# Patient Record
Sex: Male | Born: 1960 | Race: White | Hispanic: No | Marital: Married | Smoking: Never smoker
Health system: Southern US, Community
[De-identification: ages and names within clinical notes are randomized; demographics above are authoritative.]

## PROBLEM LIST (undated history)

## (undated) DIAGNOSIS — E291 Testicular hypofunction: Secondary | ICD-10-CM

## (undated) DIAGNOSIS — R3129 Other microscopic hematuria: Secondary | ICD-10-CM

## (undated) DIAGNOSIS — N529 Male erectile dysfunction, unspecified: Secondary | ICD-10-CM

## (undated) DIAGNOSIS — N400 Enlarged prostate without lower urinary tract symptoms: Secondary | ICD-10-CM

## (undated) HISTORY — DX: Testicular hypofunction: E29.1

## (undated) HISTORY — DX: Male erectile dysfunction, unspecified: N52.9

## (undated) HISTORY — DX: Benign prostatic hyperplasia without lower urinary tract symptoms: N40.0

## (undated) HISTORY — DX: Other microscopic hematuria: R31.29

---

## 1987-11-13 HISTORY — PX: APPENDECTOMY: SHX54

## 1995-11-13 HISTORY — PX: OTHER SURGICAL HISTORY: SHX169

## 1998-11-12 HISTORY — PX: HERNIA REPAIR: SHX51

## 2009-02-04 HISTORY — PX: VASECTOMY: SHX75

## 2015-05-17 ENCOUNTER — Encounter: Payer: Self-pay | Admitting: *Deleted

## 2015-05-19 ENCOUNTER — Ambulatory Visit: Payer: Self-pay | Admitting: Urology

## 2015-05-27 ENCOUNTER — Other Ambulatory Visit: Payer: Self-pay | Admitting: Urology

## 2015-05-30 NOTE — Telephone Encounter (Signed)
Pt pharmacy is requesting a refill. Pt was last seen 05/2014. Please advise.

## 2015-06-07 ENCOUNTER — Ambulatory Visit: Payer: Self-pay | Admitting: Urology

## 2015-06-13 ENCOUNTER — Ambulatory Visit (INDEPENDENT_AMBULATORY_CARE_PROVIDER_SITE_OTHER): Payer: 59 | Admitting: Urology

## 2015-06-13 ENCOUNTER — Encounter: Payer: Self-pay | Admitting: Urology

## 2015-06-13 VITALS — BP 166/95 | HR 68 | Ht 70.0 in | Wt 205.2 lb

## 2015-06-13 DIAGNOSIS — N138 Other obstructive and reflux uropathy: Secondary | ICD-10-CM

## 2015-06-13 DIAGNOSIS — N401 Enlarged prostate with lower urinary tract symptoms: Secondary | ICD-10-CM

## 2015-06-13 DIAGNOSIS — N529 Male erectile dysfunction, unspecified: Secondary | ICD-10-CM | POA: Diagnosis not present

## 2015-06-13 LAB — MICROSCOPIC EXAMINATION: BACTERIA UA: NONE SEEN

## 2015-06-13 LAB — URINALYSIS, COMPLETE
BILIRUBIN UA: NEGATIVE
GLUCOSE, UA: NEGATIVE
KETONES UA: NEGATIVE
NITRITE UA: NEGATIVE
PH UA: 5.5 (ref 5.0–7.5)
Protein, UA: NEGATIVE
Specific Gravity, UA: 1.02 (ref 1.005–1.030)
UUROB: 0.2 mg/dL (ref 0.2–1.0)

## 2015-06-13 LAB — BLADDER SCAN AMB NON-IMAGING: Scan Result: 0

## 2015-06-13 MED ORDER — TADALAFIL 5 MG PO TABS: 5.0000 mg | ORAL_TABLET | Freq: Every day | ORAL | Status: AC

## 2015-06-13 MED ORDER — SILDENAFIL CITRATE 100 MG PO TABS: 100.0000 mg | ORAL_TABLET | Freq: Every day | ORAL | Status: AC | PRN

## 2015-06-13 NOTE — Progress Notes (Signed)
06/13/2015 8:25 AM   Gerald Santos 1961-01-12 [id number]  Referring provider: No referring provider defined for this encounter.  Chief Complaint  Patient presents with  . Erectile Dysfunction    Yearly check up    HPI: Gerald Santos is a 54 year old white male with erectile dysfunction and BPH with LUTS who presents today for his yearly follow-up.  Erectile dysfunction: His SHIM score is 13, which is mild to moderate ED.   He has been having difficulty with erections for six years.   His major complaint is maintaining an erection.  His libido is good.   His risk factors for ED are hypertension and BPH.  He denies any painful erections or curvatures with his erections.   He has tried Viagra and Cialis in the past with good results. He does find some cost prohibitive.      SHIM      06/13/15 1636       SHIM: Over the last 6 months:   How do you rate your confidence that you could get and keep an erection? Low     When you had erections with sexual stimulation, how often were your erections hard enough for penetration (entering your partner)? A Few Times (much less than half the time)     During sexual intercourse, how often were you able to maintain your erection after you had penetrated (entered) your partner? Very Difficult     During sexual intercourse, how difficult was it to maintain your erection to completion of intercourse? Difficult     When you attempted sexual intercourse, how often was it satisfactory for you? Slightly Difficult     SHIM Total Score   SHIM 13        Score: 1-7 Severe ED 8-11 Moderate ED 12-16 Mild-Moderate ED 17-21 Mild ED 22-25 No ED   BPH with LUTS: His IPSS score today is 2, which is mild lower urinary tract symptomatology. He is pleased with his quality life due to his urinary symptoms. His PVR is 0 mL.    His major complaints today are urinary frequency and nocturia.   They are not bothersome to him.  He has had these symptoms for six   years.  He denies any dysuria, hematuria or suprapubic pain.   He currently taking Cialis 5 mg daily.  Previous PSA's:     0.8 ng/mL on 05/09/2012     0.2 ng/mL on 05/08/2013     1.1 ng/mL on 05/03/2014  He also denies any recent fevers, chills, nausea or vomiting.  He does not have a family history of PCa.      IPSS      06/13/15 1600       International Prostate Symptom Score   How often have you had the sensation of not emptying your bladder? Not at All     How often have you had to urinate less than every two hours? Less than 1 in 5 times     How often have you found you stopped and started again several times when you urinated? Not at All     How often have you found it difficult to postpone urination? Not at All     How often have you had a weak urinary stream? Not at All     How often have you had to strain to start urination? Not at All     How many times did you typically get up at night to urinate?  1 Time     Total IPSS Score 2     Quality of Life due to urinary symptoms   If you were to spend the rest of your life with your urinary condition just the way it is now how would you feel about that? Pleased        Score:  1-7 Mild 8-19 Moderate 20-35 Severe       PMH: Past Medical History  Diagnosis Date  . Hypogonadism in male   . Microscopic hematuria   . Erectile dysfunction   . BPH (benign prostatic hyperplasia)     Surgical History: Past Surgical History  Procedure Laterality Date  . Vasectomy  02/04/2009  . Hernia repair Left 2000  . Carpal tunnel repair  1997  . Appendectomy  1989    Home Medications:    Medication List       This list is accurate as of: 06/13/15 11:59 PM.  Always use your most recent med list.               etodolac 400 MG tablet  Commonly known as:  LODINE  Take by mouth.     lisinopril 10 MG tablet  Commonly known as:  PRINIVIL,ZESTRIL  Take 10 mg by mouth daily.     lisinopril-hydrochlorothiazide 20-12.5  MG per tablet  Commonly known as:  PRINZIDE,ZESTORETIC  TAKE 1 TABLET BY MOUTH ONCE A DAY     sildenafil 100 MG tablet  Commonly known as:  VIAGRA  Take 1 tablet (100 mg total) by mouth daily as needed for erectile dysfunction.     tadalafil 5 MG tablet  Commonly known as:  CIALIS  Take 1 tablet (5 mg total) by mouth daily.        Allergies:  Allergies  Allergen Reactions  . Codeine Other (See Comments)    Family History: Family History  Problem Relation Age of Onset  . Colon cancer Father   . Schizophrenia Mother   . Lung cancer Father   . Diabetes Mellitus II Mother   . Alcohol abuse Father     Social History:  reports that he has never smoked. He does not have any smokeless tobacco history on file. He reports that he does not drink alcohol or use illicit drugs.  ROS: UROLOGY Frequent Urination?: No Hard to postpone urination?: No Burning/pain with urination?: No Get up at night to urinate?: No Leakage of urine?: No Urine stream starts and stops?: No Trouble starting stream?: No Do you have to strain to urinate?: No Blood in urine?: No Urinary tract infection?: No Sexually transmitted disease?: No Injury to kidneys or bladder?: No Painful intercourse?: No Weak stream?: No Erection problems?: Yes Penile pain?: No  Gastrointestinal Nausea?: No Vomiting?: No Indigestion/heartburn?: No Diarrhea?: No Constipation?: No  Constitutional Fever: No Night sweats?: No Weight loss?: No Fatigue?: No  Skin Skin rash/lesions?: No Itching?: No  Eyes Blurred vision?: No Double vision?: No  Ears/Nose/Throat Sore throat?: No Sinus problems?: No  Hematologic/Lymphatic Swollen glands?: No Easy bruising?: No  Cardiovascular Leg swelling?: No Chest pain?: No  Respiratory Cough?: No Shortness of breath?: No  Endocrine Excessive thirst?: No  Musculoskeletal Back pain?: No Joint pain?: No  Neurological Headaches?: No Dizziness?:  No  Psychologic Depression?: No Anxiety?: No  Physical Exam: BP 166/95 mmHg  Pulse 68  Ht 5\' 10"  (1.778 m)  Wt 205 lb 3.2 oz (93.078 kg)  BMI 29.44 kg/m2  GU: Patient with  uncircumcised phallus. Foreskin easily retracted,  though a band is forming around coronal edge.  Urethral meatus is patent.  No penile discharge.  There is a erythematous,  flat lesion located at the 7:00 position on the glands from the meatus. It is irregular and 2 mm by 3 mm. The patient states it comes and goes with sexual activity.  Scrotum without lesions, cysts, rashes and/or edema.  Testicles are located scrotally bilaterally. No masses are appreciated in the testicles. Left and right epididymis are normal. Rectal: Patient with  normal sphincter tone. Perineum without scarring or rashes. No rectal masses are appreciated. Prostate is approximately 45 grams, no nodules are appreciated. Seminal vesicles are normal.    Laboratory Data: Results for orders placed or performed in visit on 06/13/15  Microscopic Examination  Result Value Ref Range   WBC, UA 0-5 0 -  5 /hpf   RBC, UA 0-2 0 -  2 /hpf   Epithelial Cells (non renal) CANCELED    Bacteria, UA None seen None seen/Few  Urinalysis, Complete  Result Value Ref Range   Specific Gravity, UA 1.020 1.005 - 1.030   pH, UA 5.5 5.0 - 7.5   Color, UA Yellow Yellow   Appearance Ur Clear Clear   Leukocytes, UA Trace (A) Negative   Protein, UA Negative Negative/Trace   Glucose, UA Negative Negative   Ketones, UA Negative Negative   RBC, UA Trace (A) Negative   Bilirubin, UA Negative Negative   Urobilinogen, Ur 0.2 0.2 - 1.0 mg/dL   Nitrite, UA Negative Negative   Microscopic Examination See below:   PSA  Result Value Ref Range   Prostate Specific Ag, Serum 0.8 0.0 - 4.0 ng/mL  BLADDER SCAN AMB NON-IMAGING  Result Value Ref Range   Scan Result 0    No results found for: WBC, HGB, HCT, MCV, PLT  No results found for: CREATININE  Lab Results  Component  Value Date   PSA 0.8 06/13/2015    No results found for: TESTOSTERONE  No results found for: HGBA1C  Urinalysis    Component Value Date/Time   GLUCOSEU Negative 06/13/2015 1619   BILIRUBINUR Negative 06/13/2015 1619   NITRITE Negative 06/13/2015 1619   LEUKOCYTESUR Trace* 06/13/2015 1619    Pertinent Imaging:   Assessment & Plan:    1. Erectile dysfunction, unspecified erectile dysfunction type:   Patient's SHIM score is 13.  He responds well to Viagra. A prescription for Viagra 100 mg is he scribed to his pharmacy. He will follow-up in one year for SHIM score.  2. BPH with LUTS:   Patient's IPSS score is 2/1.  His PVR 0 mL.  His DRE demonstrates mild enlargement.  Patient like to continue medical treatment.  I have sent a prescription to his pharmacy for Cialis 5 mg daily.  He will follow up in 12  months for a PSA, DRE, PVR and an IPSS.    - Urinalysis, Complete - PSA - BLADDER SCAN AMB NON-IMAGING   Return in about 1 year (around 06/12/2016) for IPSS, SHIM and PVR.  PSA to be drawn before appointment   Zara Council, [place] [address] [place], [place] [address] [phone]

## 2015-06-14 ENCOUNTER — Telehealth: Payer: Self-pay

## 2015-06-14 DIAGNOSIS — R972 Elevated prostate specific antigen [PSA]: Secondary | ICD-10-CM

## 2015-06-14 LAB — PSA: Prostate Specific Ag, Serum: 0.8 ng/mL (ref 0.0–4.0)

## 2015-06-14 NOTE — Telephone Encounter (Signed)
Pt returned call. Lab results were given. Pt voiced understanding.

## 2015-06-14 NOTE — Telephone Encounter (Signed)
-----   Message from Nori Riis, PA-C sent at 06/14/2015  8:36 AM EDT ----- Patient's PSA is stable.  We will see him in 12 months.  PSA to be drawn before his next appointment.

## 2015-06-14 NOTE — Telephone Encounter (Signed)
LMOM

## 2015-06-16 DIAGNOSIS — N138 Other obstructive and reflux uropathy: Secondary | ICD-10-CM | POA: Insufficient documentation

## 2015-06-16 DIAGNOSIS — N529 Male erectile dysfunction, unspecified: Secondary | ICD-10-CM | POA: Insufficient documentation

## 2015-06-16 DIAGNOSIS — N401 Enlarged prostate with lower urinary tract symptoms: Secondary | ICD-10-CM

## 2016-06-04 ENCOUNTER — Other Ambulatory Visit: Payer: 59

## 2016-06-04 ENCOUNTER — Encounter: Payer: Self-pay | Admitting: Urology

## 2016-06-12 ENCOUNTER — Encounter: Payer: Self-pay | Admitting: Urology

## 2016-06-12 ENCOUNTER — Ambulatory Visit: Payer: 59 | Admitting: Urology

## 2016-10-19 ENCOUNTER — Other Ambulatory Visit: Payer: Self-pay | Admitting: Gastroenterology

## 2016-10-19 DIAGNOSIS — R1310 Dysphagia, unspecified: Secondary | ICD-10-CM

## 2016-10-23 ENCOUNTER — Ambulatory Visit
Admission: RE | Admit: 2016-10-23 | Discharge: 2016-10-23 | Disposition: A | Discharge status: Home / Self Care | Payer: Managed Care, Other (non HMO) | Source: Ambulatory Visit | Attending: Gastroenterology | Admitting: Gastroenterology

## 2016-10-23 DIAGNOSIS — K219 Gastro-esophageal reflux disease without esophagitis: Secondary | ICD-10-CM | POA: Insufficient documentation

## 2016-10-23 DIAGNOSIS — R1310 Dysphagia, unspecified: Secondary | ICD-10-CM

## 2016-11-20 ENCOUNTER — Other Ambulatory Visit: Payer: Self-pay | Admitting: Gastroenterology

## 2016-11-20 DIAGNOSIS — D369 Benign neoplasm, unspecified site: Secondary | ICD-10-CM

## 2016-11-30 ENCOUNTER — Inpatient Hospital Stay: Admission: RE | Admit: 2016-11-30 | Payer: Managed Care, Other (non HMO) | Source: Ambulatory Visit

## 2016-12-07 ENCOUNTER — Ambulatory Visit
Admission: RE | Admit: 2016-12-07 | Discharge: 2016-12-07 | Disposition: A | Discharge status: Home / Self Care | Payer: Managed Care, Other (non HMO) | Source: Ambulatory Visit | Attending: Gastroenterology | Admitting: Gastroenterology

## 2016-12-07 DIAGNOSIS — D369 Benign neoplasm, unspecified site: Secondary | ICD-10-CM

## 2019-12-22 ENCOUNTER — Other Ambulatory Visit: Payer: Self-pay | Admitting: Internal Medicine

## 2019-12-22 DIAGNOSIS — Z800 Family history of malignant neoplasm of digestive organs: Secondary | ICD-10-CM

## 2019-12-22 DIAGNOSIS — Z86010 Personal history of colonic polyps: Secondary | ICD-10-CM

## 2020-01-11 ENCOUNTER — Ambulatory Visit
Admission: RE | Admit: 2020-01-11 | Discharge: 2020-01-11 | Disposition: A | Discharge status: Home / Self Care | Payer: Managed Care, Other (non HMO) | Source: Ambulatory Visit | Attending: Internal Medicine | Admitting: Internal Medicine

## 2020-01-11 DIAGNOSIS — Z86010 Personal history of colonic polyps: Secondary | ICD-10-CM

## 2020-01-11 DIAGNOSIS — Z800 Family history of malignant neoplasm of digestive organs: Secondary | ICD-10-CM

## 2020-02-07 ENCOUNTER — Ambulatory Visit: Payer: 59 | Attending: Internal Medicine

## 2020-02-07 DIAGNOSIS — Z23 Encounter for immunization: Secondary | ICD-10-CM

## 2020-02-07 NOTE — Progress Notes (Signed)
   Covid-19 Vaccination Clinic  Name:  Gerald Santos    MRN: [id number] DOB: April 22, 1961  02/07/2020  Gerald Santos was observed post Covid-19 immunization for 15 minutes without incident. He was provided with Vaccine Information Sheet and instruction to access the V-Safe system.   Gerald Santos was instructed to call 911 with any severe reactions post vaccine: Marland Kitchen Difficulty breathing  . Swelling of face and throat  . A fast heartbeat  . A bad rash all over body  . Dizziness and weakness   Immunizations Administered    Name Date Dose VIS Date Route   Pfizer COVID-19 Vaccine 02/07/2020 10:59 AM 0.3 mL 10/23/2019 Intramuscular   Manufacturer: [employer]   Lot: [id number]   [place]: [id number]

## 2020-03-01 ENCOUNTER — Ambulatory Visit: Payer: 59 | Attending: Internal Medicine

## 2020-03-01 DIAGNOSIS — Z23 Encounter for immunization: Secondary | ICD-10-CM

## 2020-03-01 NOTE — Progress Notes (Signed)
   Covid-19 Vaccination Clinic  Name:  Gerald Santos    MRN: [id number] DOB: Aug 21, 1961  03/01/2020  [name] was observed post Covid-19 immunization for 15 minutes without incident. He was provided with Vaccine Information Sheet and instruction to access the V-Safe system.   [name] was instructed to call 911 with any severe reactions post vaccine: Marland Kitchen Difficulty breathing  . Swelling of face and throat  . A fast heartbeat  . A bad rash all over body  . Dizziness and weakness   Immunizations Administered    Name Date Dose VIS Date Route   Pfizer COVID-19 Vaccine 03/01/2020  1:34 PM 0.3 mL 01/06/2019 Intramuscular   Manufacturer: [place]   Lot: [id number]   [place]: [id number]

## 2020-11-26 IMAGING — CT CT VIRTUAL COLONOSCOPY SCREENING
3 of 9 series · 15 of 46 positions shown, 17 images · non-contrast
Comparison: 12/07/2016

CLINICAL DATA: Screening

EXAM:
CT VIRTUAL COLONOSCOPY SCREENING
TECHNIQUE: The patient was given a standard bowel preparation with Gastrografin
and barium for fluid and stool tagging respectively. The quality of
the bowel preparation is moderate. Automated CO2 insufflation of the
colon was performed prior to image acquisition and colonic
distention is moderate. Image post processing was used to generate a
3D endoluminal fly-through projection of the colon and to
electronically subtract stool/fluid as appropriate.

[Series 5: supine colon 3.00 br40 s3 cor supine · coronal · 0.88mm/px · 3 of 108 slices shown]
[im 27/108  soft-tissue]
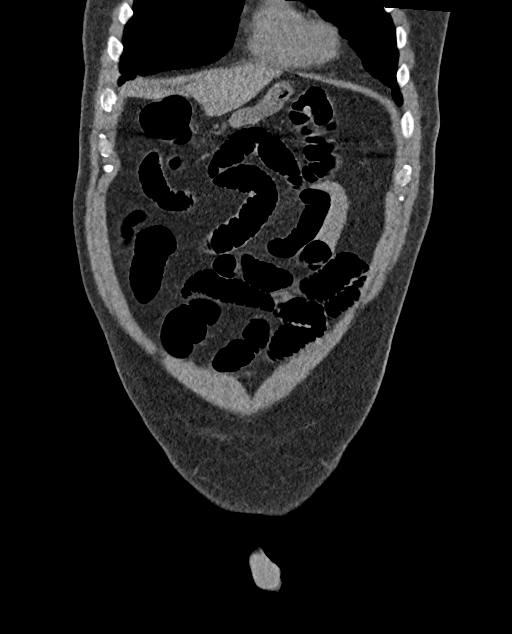
[im 54/108  soft-tissue]
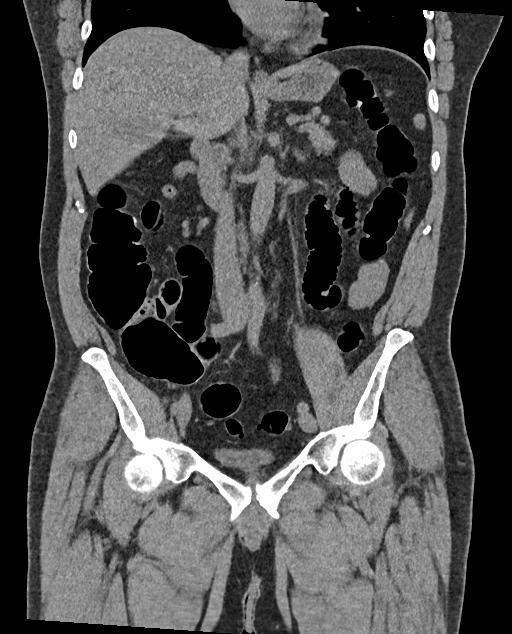
[im 81/108  soft-tissue]
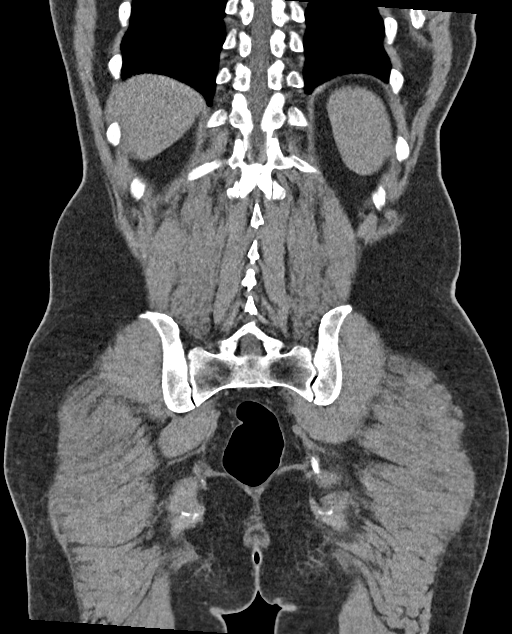

[Series 10: prone colon 1.50 br40 s3 prone thin · axial · 0.80mm/px · z∈[+1114,+1570]mm · 9 of 382 slices shown, 11 images]
[im 39/382  soft-tissue]
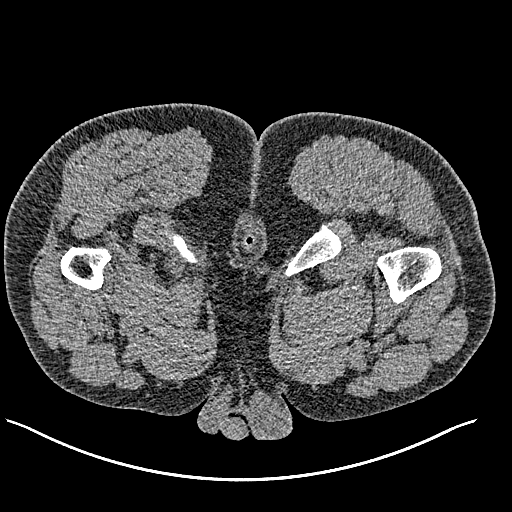
[im 39/382  bone]
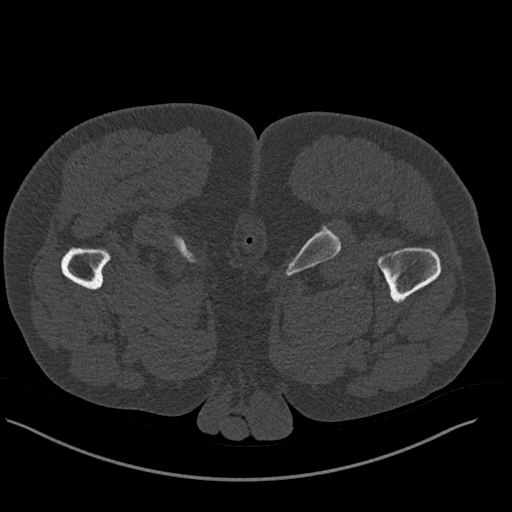
[im 77/382  soft-tissue]
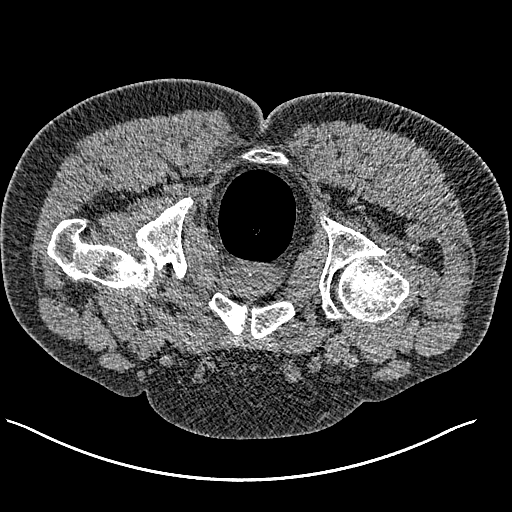
[im 115/382  soft-tissue]
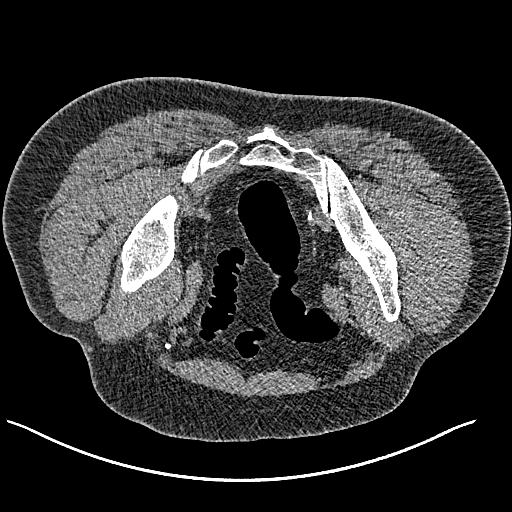
[im 153/382  soft-tissue]
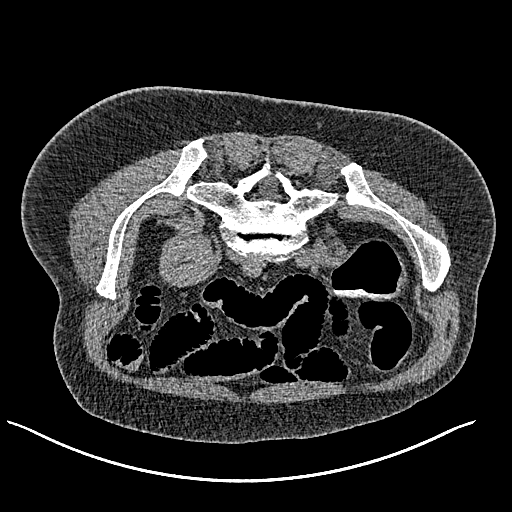
[im 191/382  soft-tissue]
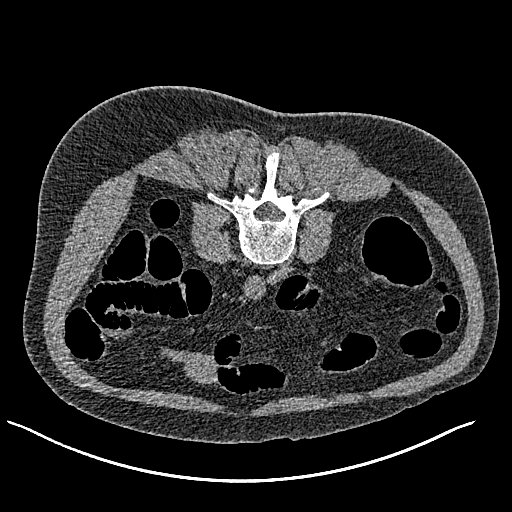
[im 229/382  soft-tissue]
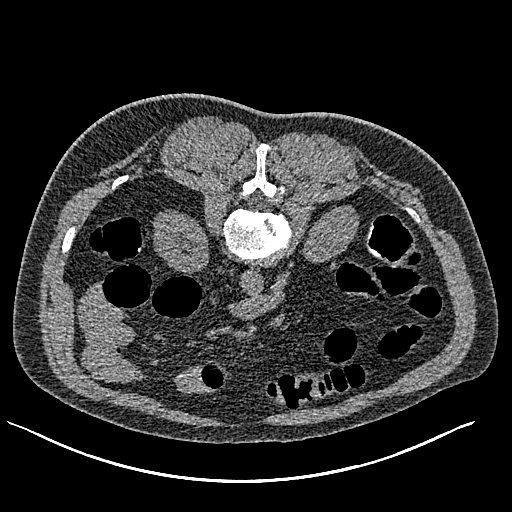
[im 267/382  soft-tissue]
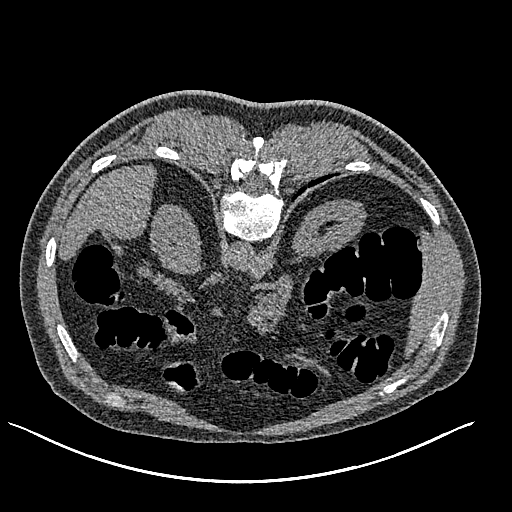
[im 305/382  soft-tissue]
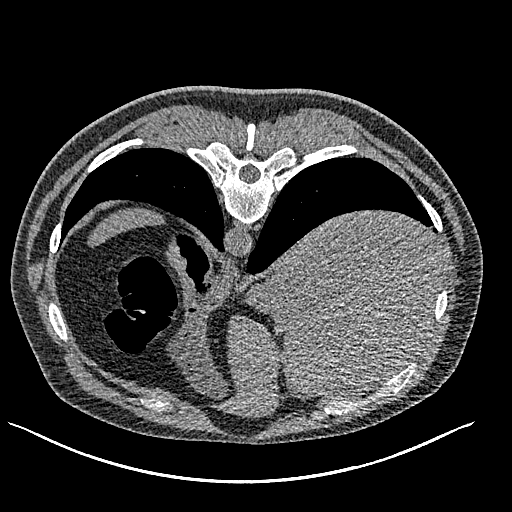
[im 343/382  soft-tissue]
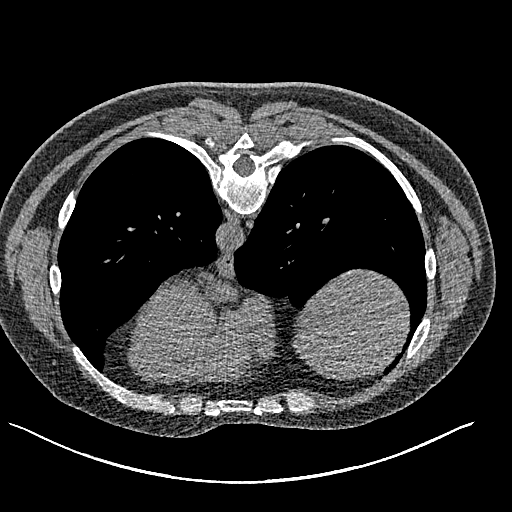
[im 343/382  bone]
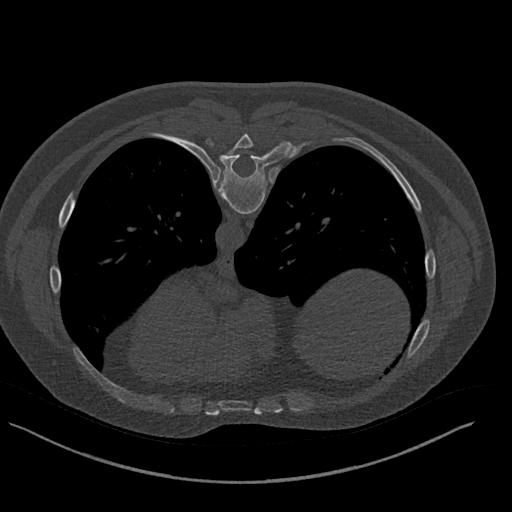

[Series 11: prone colon 3.00 br40 s3 prone 3mm · axial · 0.85mm/px · z∈[+1199,+1484]mm · 3 of 191 slices shown]
[im 48/191  soft-tissue]
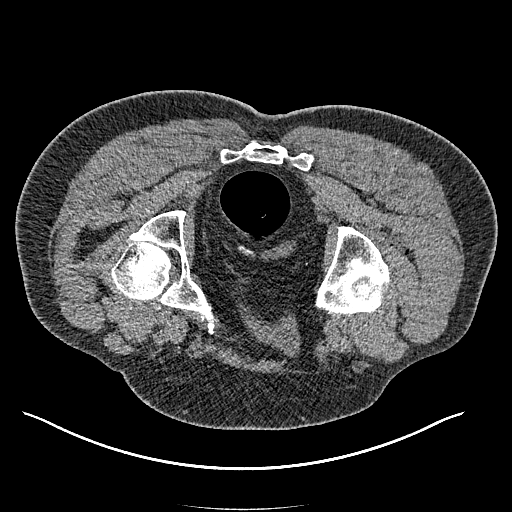
[im 96/191  soft-tissue]
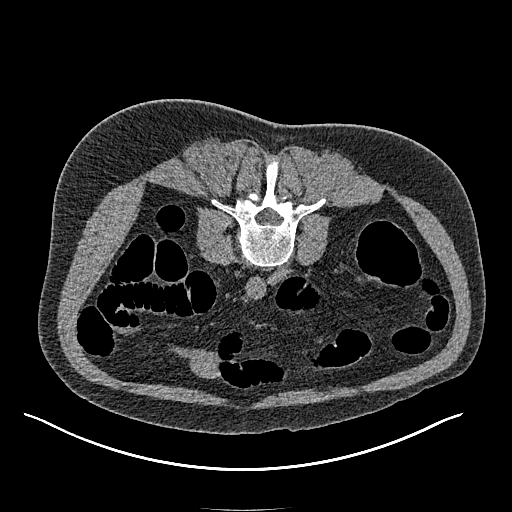
[im 143/191  soft-tissue]
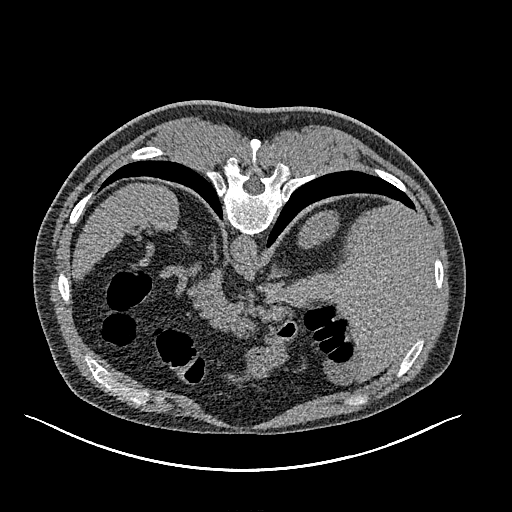

[15 of 46 positions shown; findings below may reference images not displayed]

FINDINGS: VIRTUAL COLONOSCOPY

Moderate retained barium and barium retained at stool. Under
distention of the sigmoid colon on both supine and prone imaging.
Scattered sigmoid diverticulosis. No visible fixed non barium tagged
polypoid filling defect or annular constricting lesion.

Virtual colonoscopy is not designed to detect diminutive polyps
(i.e., less than or equal to 5 mm), the presence or absence of which
may not affect clinical management.

CT ABDOMEN AND PELVIS WITHOUT CONTRAST

Lower chest: Lung bases are clear. No effusions. Heart is normal
size.

Hepatobiliary: No focal hepatic abnormality. Gallbladder
unremarkable.

Pancreas: No focal abnormality or ductal dilatation.

Spleen: No focal abnormality.  Normal size.

Adrenals/Urinary Tract: No adrenal abnormality. No focal renal
abnormality. No stones or hydronephrosis. Urinary bladder is
unremarkable. Urinary bladder decompressed.

Stomach/Bowel: Stomach and small bowel decompressed, unremarkable.

Vascular/Lymphatic: No evidence of aneurysm or adenopathy.

Reproductive: No visible focal abnormality.

Other: No free fluid or free air.

Musculoskeletal: No acute bony abnormality.
IMPRESSION: Moderate retained barium and barium tagged stool. Under distention
of the sigmoid colon with scattered sigmoid diverticula. No visible
fixed polypoid filling defects or annular constricting lesions.

No acute or significant extra colonic abnormality.
